# Patient Record
Sex: Female | Born: 1960 | Race: White | Hispanic: No | Marital: Married | Smoking: Never smoker
Health system: Southern US, Community
[De-identification: ages and names within clinical notes are randomized; demographics above are authoritative.]

## PROBLEM LIST (undated history)

## (undated) DIAGNOSIS — K602 Anal fissure, unspecified: Secondary | ICD-10-CM

## (undated) DIAGNOSIS — D649 Anemia, unspecified: Secondary | ICD-10-CM

## (undated) DIAGNOSIS — R51 Headache: Secondary | ICD-10-CM

## (undated) DIAGNOSIS — Z87442 Personal history of urinary calculi: Secondary | ICD-10-CM

## (undated) DIAGNOSIS — K219 Gastro-esophageal reflux disease without esophagitis: Secondary | ICD-10-CM

## (undated) DIAGNOSIS — C801 Malignant (primary) neoplasm, unspecified: Secondary | ICD-10-CM

## (undated) DIAGNOSIS — E063 Autoimmune thyroiditis: Secondary | ICD-10-CM

## (undated) DIAGNOSIS — K589 Irritable bowel syndrome without diarrhea: Secondary | ICD-10-CM

## (undated) DIAGNOSIS — R519 Headache, unspecified: Secondary | ICD-10-CM

## (undated) HISTORY — PX: COLONOSCOPY: SHX174

---

## 1984-04-15 HISTORY — PX: TUBAL LIGATION: SHX77

## 2001-04-15 HISTORY — PX: OTHER SURGICAL HISTORY: SHX169

## 2011-04-16 HISTORY — PX: BREAST SURGERY: SHX581

## 2016-12-25 ENCOUNTER — Ambulatory Visit: Payer: Self-pay | Admitting: General Surgery

## 2016-12-25 NOTE — H&P (Signed)
History of Present Illness Ralene Ok MD; 12/25/2016 2:19 PM) The patient is a 56 year old female who presents with an anal fissure. Patient is a 56 year old female who comes in today from a referral from Dr. Paulita Fujita. Patient has had a long-standing history of anal fissures. States this was since the birth of her child. Patient states that she has on and off resolution of the anal fissure. She states that recently over the last 6 months nothing seems to help. She has tried diltiazem as well as nitroglycerin with no resolution. She continues with anal pain as well as bleeding. She states that she has bleeding with bowel movements as well as in between bowel movements. Patient does state that she has tried sitz baths without any relief.   Past Surgical History Erline Levine, RN; 12/25/2016 1:49 PM) Breast Augmentation  Bilateral.  Diagnostic Studies History Erline Levine, RN; 12/25/2016 1:49 PM) Colonoscopy  >10 years ago Mammogram  >3 years ago Pap Smear  1-5 years ago  Allergies Erline Levine, RN; 12/25/2016 1:56 PM) No Known Drug Allergies 12/25/2016  Medication History Erline Levine, RN; 12/25/2016 1:58 PM) Zolpidem Tartrate (5MG  Tablet, Oral) Active. Cytomel (25MCG Tablet, Oral) Active. Doxycycline Hyclate (100MG  Capsule, Oral) Active. MetroNIDAZOLE (1% Gel, External) Active. Ondansetron HCl (8MG  Tablet, Oral) Active. Spironolactone (50MG  Tablet, Oral) Active. Synthroid (112MCG Tablet, Oral) Active. ValACYclovir HCl (1GM Tablet, Oral) Active. Medications Reconciled  Social History Erline Levine, RN; 12/25/2016 1:49 PM) Caffeine use  Coffee, Tea. No alcohol use  No drug use  Tobacco use  Never smoker.  Family History Erline Levine, RN; 12/25/2016 1:49 PM) Bleeding disorder  Father. Cancer  Father. Hypertension  Mother. Thyroid problems  Daughter, Mother, Sister.  Pregnancy / Birth History Erline Levine, RN; 12/25/2016 1:49 PM) Age at menarche  29  years. Age of menopause  93-56 Gravida  2 Irregular periods  Length (months) of breastfeeding  3-6 Maternal age  18-25 Para  2  Other Problems Erline Levine, RN; 12/25/2016 1:49 PM) Gastroesophageal Reflux Disease  Hemorrhoids  Melanoma  Migraine Headache  Thyroid Disease     Review of Systems Erline Levine RN; 12/25/2016 1:49 PM) General Present- Appetite Loss, Fatigue and Weight Loss. Not Present- Chills, Fever, Night Sweats and Weight Gain. Skin Not Present- Change in Wart/Mole, Dryness, Hives, Jaundice, New Lesions, Non-Healing Wounds, Rash and Ulcer. HEENT Not Present- Earache, Hearing Loss, Hoarseness, Nose Bleed, Oral Ulcers, Ringing in the Ears, Seasonal Allergies, Sinus Pain, Sore Throat, Visual Disturbances, Wears glasses/contact lenses and Yellow Eyes. Respiratory Not Present- Bloody sputum, Chronic Cough, Difficulty Breathing, Snoring and Wheezing. Breast Not Present- Breast Mass, Breast Pain, Nipple Discharge and Skin Changes. Cardiovascular Not Present- Chest Pain, Difficulty Breathing Lying Down, Leg Cramps, Palpitations, Rapid Heart Rate, Shortness of Breath and Swelling of Extremities. Gastrointestinal Present- Abdominal Pain, Bloating, Bloody Stool, Change in Bowel Habits, Chronic diarrhea, Constipation, Excessive gas, Gets full quickly at meals, Indigestion, Nausea, Rectal Pain and Vomiting. Not Present- Difficulty Swallowing and Hemorrhoids. Female Genitourinary Present- Pelvic Pain. Not Present- Frequency, Nocturia, Painful Urination and Urgency. Musculoskeletal Present- Joint Pain. Not Present- Back Pain, Joint Stiffness, Muscle Pain, Muscle Weakness and Swelling of Extremities. Neurological Present- Headaches. Not Present- Decreased Memory, Fainting, Numbness, Seizures, Tingling, Tremor, Trouble walking and Weakness. Psychiatric Not Present- Anxiety, Bipolar, Change in Sleep Pattern, Depression, Fearful and Frequent crying. Endocrine Not Present- Cold  Intolerance, Excessive Hunger, Hair Changes, Heat Intolerance, Hot flashes and New Diabetes. Hematology Not Present- Blood Thinners, Easy Bruising, Excessive bleeding, Gland problems, HIV  and Persistent Infections.  Vitals Erline Levine RN; 12/25/2016 1:59 PM) 12/25/2016 1:58 PM Weight: 114.8 lb Height: 64in Body Surface Area: 1.54 m Body Mass Index: 19.71 kg/m  Temp.: 98.64F  Pulse: 78 (Regular)  BP: 110/70 (Sitting, Left Arm, Standard)       Physical Exam Ralene Ok MD; 12/25/2016 2:20 PM) The physical exam findings are as follows: Note:Constitutional: No acute distress, conversant, appears stated age  Eyes: Anicteric sclerae, moist conjunctiva, no lid lag  Neck: No thyromegaly, trachea midline, no cervical lymphadenopathy  Lungs: Clear to auscultation biilaterally, normal respiratory effot  Cardiovascular: regular rate & rhythm, no murmurs, no peripheal edema, pedal pulses 2+  GI: Soft, no masses or hepatosplenomegaly, non-tender to palpation  Rectal: Patient has a posterior skin tag, hypertensive anal sphincter, inflammation-all CONSISTENT with anal fissure.   MSK: Normal gait, no clubbing cyanosis, edema  Skin: No rashes, palpation reveals normal skin turgor  Psychiatric: Appropriate judgment and insight, oriented to person, place, and time    Assessment & Plan Ralene Ok MD; 12/25/2016 2:22 PM) ANAL FISSURE (K60.2) Impression: 56 year old female with anal fissure. Patient has had a long history of anal fissure and has had multiple medical attempts at resolution. Patient continued has continued with a lot of anal pain. I discussed with her that we could proceed with either a chemical versus mechanical sphincterotomy. The patient will like to proceed with chemical sphincterotomy. I discussed with her the risks and benefits of the procedure to include but not limited to: Infection, bleeding, damage structures, possible incontinence. Patient  voiced understanding and wishes to proceed.

## 2016-12-26 ENCOUNTER — Encounter (HOSPITAL_COMMUNITY): Payer: Self-pay | Admitting: *Deleted

## 2016-12-26 NOTE — Progress Notes (Signed)
Spoke with pt for pre-op call. Pt denies cardiac history, chest pain or sob. Pt states she is not diabetic.  

## 2016-12-26 NOTE — Anesthesia Preprocedure Evaluation (Addendum)
Anesthesia Evaluation  Patient identified by MRN, date of birth, ID band Patient awake    Reviewed: Allergy & Precautions, NPO status , Patient's Chart, lab work & pertinent test results  Airway Mallampati: I  TM Distance: >3 FB Neck ROM: Full    Dental  (+) Dental Advisory Given, Teeth Intact   Pulmonary neg pulmonary ROS,    Pulmonary exam normal breath sounds clear to auscultation       Cardiovascular negative cardio ROS Normal cardiovascular exam Rhythm:Regular Rate:Normal     Neuro/Psych  Headaches, negative psych ROS   GI/Hepatic Neg liver ROS, GERD  ,IBS   Endo/Other  negative endocrine ROSHashimoto's Thyroiditis  Renal/GU negative Renal ROS  negative genitourinary   Musculoskeletal negative musculoskeletal ROS (+)   Abdominal   Peds  Hematology  (+) anemia ,   Anesthesia Other Findings Melanoma of RUE  Reproductive/Obstetrics                           Anesthesia Physical Anesthesia Plan  ASA: I  Anesthesia Plan: General   Post-op Pain Management:    Induction: Intravenous  PONV Risk Score and Plan: 4 or greater and Ondansetron, Dexamethasone, Midazolam, Scopolamine patch - Pre-op and Treatment may vary due to age or medical condition  Airway Management Planned: Oral ETT  Additional Equipment: None  Intra-op Plan:   Post-operative Plan: Extubation in OR  Informed Consent: I have reviewed the patients History and Physical, chart, labs and discussed the procedure including the risks, benefits and alternatives for the proposed anesthesia with the patient or authorized representative who has indicated his/her understanding and acceptance.   Dental advisory given and Dental Advisory Given  Plan Discussed with: CRNA and Anesthesiologist  Anesthesia Plan Comments: (LMA if patient to be in stirrups, ETT if prone)      Anesthesia Quick Evaluation

## 2016-12-27 ENCOUNTER — Encounter (HOSPITAL_COMMUNITY)
Admission: RE | Disposition: A | Discharge status: Home / Self Care | Payer: Self-pay | Source: Ambulatory Visit | Attending: General Surgery

## 2016-12-27 ENCOUNTER — Ambulatory Visit (HOSPITAL_COMMUNITY)
Admission: RE | Admit: 2016-12-27 | Discharge: 2016-12-27 | Disposition: A | Discharge status: Home / Self Care | Payer: BLUE CROSS/BLUE SHIELD | Source: Ambulatory Visit | Attending: General Surgery | Admitting: General Surgery

## 2016-12-27 ENCOUNTER — Ambulatory Visit (HOSPITAL_COMMUNITY): Payer: BLUE CROSS/BLUE SHIELD | Admitting: Anesthesiology

## 2016-12-27 ENCOUNTER — Encounter (HOSPITAL_COMMUNITY): Payer: Self-pay

## 2016-12-27 DIAGNOSIS — Z79899 Other long term (current) drug therapy: Secondary | ICD-10-CM | POA: Insufficient documentation

## 2016-12-27 DIAGNOSIS — E079 Disorder of thyroid, unspecified: Secondary | ICD-10-CM | POA: Diagnosis not present

## 2016-12-27 DIAGNOSIS — K219 Gastro-esophageal reflux disease without esophagitis: Secondary | ICD-10-CM | POA: Insufficient documentation

## 2016-12-27 DIAGNOSIS — K602 Anal fissure, unspecified: Secondary | ICD-10-CM | POA: Diagnosis not present

## 2016-12-27 DIAGNOSIS — R51 Headache: Secondary | ICD-10-CM | POA: Insufficient documentation

## 2016-12-27 HISTORY — DX: Gastro-esophageal reflux disease without esophagitis: K21.9

## 2016-12-27 HISTORY — DX: Malignant (primary) neoplasm, unspecified: C80.1

## 2016-12-27 HISTORY — DX: Irritable bowel syndrome without diarrhea: K58.9

## 2016-12-27 HISTORY — DX: Anal fissure, unspecified: K60.2

## 2016-12-27 HISTORY — DX: Personal history of urinary calculi: Z87.442

## 2016-12-27 HISTORY — DX: Headache, unspecified: R51.9

## 2016-12-27 HISTORY — DX: Anemia, unspecified: D64.9

## 2016-12-27 HISTORY — DX: Headache: R51

## 2016-12-27 HISTORY — DX: Autoimmune thyroiditis: E06.3

## 2016-12-27 LAB — BASIC METABOLIC PANEL
Anion gap: 7 (ref 5–15)
BUN: 7 mg/dL (ref 6–20)
CHLORIDE: 104 mmol/L (ref 101–111)
CO2: 27 mmol/L (ref 22–32)
CREATININE: 0.68 mg/dL (ref 0.44–1.00)
Calcium: 10 mg/dL (ref 8.9–10.3)
GFR calc non Af Amer: >60 mL/min (ref >60)
Glucose, Bld: 96 mg/dL (ref 65–99)
Potassium: 3.9 mmol/L (ref 3.5–5.1)
Sodium: 138 mmol/L (ref 135–145)

## 2016-12-27 LAB — CBC
HEMATOCRIT: 43.4 % (ref 36.0–46.0)
HEMOGLOBIN: 14.6 g/dL (ref 12.0–15.0)
MCH: 31.7 pg (ref 26.0–34.0)
MCHC: 33.6 g/dL (ref 30.0–36.0)
MCV: 94.3 fL (ref 78.0–100.0)
Platelets: 266 10*3/uL (ref 150–400)
RBC: 4.6 MIL/uL (ref 3.87–5.11)
RDW: 12.3 % (ref 11.5–15.5)
WBC: 5 10*3/uL (ref 4.0–10.5)

## 2016-12-27 SURGERY — EXAM UNDER ANESTHESIA
Anesthesia: General | Site: Buttocks

## 2016-12-27 MED ORDER — HYDROMORPHONE HCL 1 MG/ML IJ SOLN: 0.2500 mg | INTRAMUSCULAR | Status: DC | PRN

## 2016-12-27 MED ORDER — CHLORHEXIDINE GLUCONATE CLOTH 2 % EX PADS: 6.0000 | MEDICATED_PAD | Freq: Once | CUTANEOUS | Status: DC

## 2016-12-27 MED ORDER — MIDAZOLAM HCL 5 MG/5ML IJ SOLN
INTRAMUSCULAR | Status: DC | PRN
  Administered 2016-12-27: 2 mg via INTRAVENOUS

## 2016-12-27 MED ORDER — LACTATED RINGERS IV SOLN
INTRAVENOUS | Status: DC
  Administered 2016-12-27: 08:00:00 via INTRAVENOUS

## 2016-12-27 MED ORDER — 0.9 % SODIUM CHLORIDE (POUR BTL) OPTIME
TOPICAL | Status: DC | PRN
  Administered 2016-12-27: 1000 mL

## 2016-12-27 MED ORDER — OXYCODONE HCL 5 MG/5ML PO SOLN: 5.0000 mg | Freq: Once | ORAL | Status: DC | PRN

## 2016-12-27 MED ORDER — FENTANYL CITRATE (PF) 250 MCG/5ML IJ SOLN
INTRAMUSCULAR | Status: AC
  Filled 2016-12-27: qty 5

## 2016-12-27 MED ORDER — TRAMADOL HCL 50 MG PO TABS: 50.0000 mg | ORAL_TABLET | Freq: Four times a day (QID) | ORAL | 0 refills | Status: AC | PRN

## 2016-12-27 MED ORDER — EPHEDRINE SULFATE 50 MG/ML IJ SOLN
INTRAMUSCULAR | Status: DC | PRN
  Administered 2016-12-27: 10 mg via INTRAVENOUS

## 2016-12-27 MED ORDER — BUPIVACAINE LIPOSOME 1.3 % IJ SUSP
INTRAMUSCULAR | Status: DC | PRN
  Administered 2016-12-27: 40 mL

## 2016-12-27 MED ORDER — LACTATED RINGERS IV SOLN
INTRAVENOUS | Status: DC | PRN
  Administered 2016-12-27 (×2): via INTRAVENOUS

## 2016-12-27 MED ORDER — BUPIVACAINE-EPINEPHRINE (PF) 0.5% -1:200000 IJ SOLN
INTRAMUSCULAR | Status: DC | PRN
  Administered 2016-12-27: 1.8 mL via PERINEURAL

## 2016-12-27 MED ORDER — BUPIVACAINE-EPINEPHRINE (PF) 0.5% -1:200000 IJ SOLN
INTRAMUSCULAR | Status: AC
  Filled 2016-12-27: qty 30

## 2016-12-27 MED ORDER — ONABOTULINUMTOXINA 100 UNITS IJ SOLR
50.0000 [IU] | INTRAMUSCULAR | Status: AC
  Administered 2016-12-27: 100 [IU] via INTRAMUSCULAR
  Filled 2016-12-27 (×2): qty 100

## 2016-12-27 MED ORDER — MIDAZOLAM HCL 2 MG/2ML IJ SOLN
INTRAMUSCULAR | Status: AC
  Filled 2016-12-27: qty 2

## 2016-12-27 MED ORDER — PROPOFOL 10 MG/ML IV BOLUS
INTRAVENOUS | Status: DC | PRN
  Administered 2016-12-27: 150 mg via INTRAVENOUS

## 2016-12-27 MED ORDER — PROPOFOL 10 MG/ML IV BOLUS
INTRAVENOUS | Status: AC
  Filled 2016-12-27: qty 20

## 2016-12-27 MED ORDER — DIBUCAINE 1 % RE OINT
TOPICAL_OINTMENT | RECTAL | Status: AC
  Filled 2016-12-27: qty 28

## 2016-12-27 MED ORDER — FLEET ENEMA 7-19 GM/118ML RE ENEM: 1.0000 | ENEMA | Freq: Once | RECTAL | Status: DC

## 2016-12-27 MED ORDER — PHENYLEPHRINE HCL 10 MG/ML IJ SOLN
INTRAMUSCULAR | Status: DC | PRN
  Administered 2016-12-27 (×2): 80 ug via INTRAVENOUS

## 2016-12-27 MED ORDER — FENTANYL CITRATE (PF) 100 MCG/2ML IJ SOLN
INTRAMUSCULAR | Status: DC | PRN
  Administered 2016-12-27: 100 ug via INTRAVENOUS

## 2016-12-27 MED ORDER — LIDOCAINE HCL (CARDIAC) 20 MG/ML IV SOLN
INTRAVENOUS | Status: DC | PRN
  Administered 2016-12-27: 80 mg via INTRAVENOUS

## 2016-12-27 MED ORDER — OXYCODONE HCL 5 MG PO TABS: 5.0000 mg | ORAL_TABLET | Freq: Once | ORAL | Status: DC | PRN

## 2016-12-27 MED ORDER — BUPIVACAINE LIPOSOME 1.3 % IJ SUSP
20.0000 mL | INTRAMUSCULAR | Status: DC
  Filled 2016-12-27: qty 20

## 2016-12-27 MED ORDER — DEXAMETHASONE SODIUM PHOSPHATE 10 MG/ML IJ SOLN
INTRAMUSCULAR | Status: DC | PRN
  Administered 2016-12-27: 10 mg via INTRAVENOUS

## 2016-12-27 SURGICAL SUPPLY — 45 items
BLADE SURG 15 STRL LF DISP TIS (BLADE) ×1 IMPLANT
BLADE SURG 15 STRL SS (BLADE) ×2
CANISTER SUCT 3000ML PPV (MISCELLANEOUS) ×3 IMPLANT
COVER MAYO STAND STRL (DRAPES) ×3 IMPLANT
COVER SURGICAL LIGHT HANDLE (MISCELLANEOUS) ×3 IMPLANT
DECANTER SPIKE VIAL GLASS SM (MISCELLANEOUS) ×3 IMPLANT
DRSG PAD ABDOMINAL 8X10 ST (GAUZE/BANDAGES/DRESSINGS) ×3 IMPLANT
ELECT CAUTERY BLADE 6.4 (BLADE) ×3 IMPLANT
ELECT REM PT RETURN 9FT ADLT (ELECTROSURGICAL) ×3
ELECTRODE REM PT RTRN 9FT ADLT (ELECTROSURGICAL) ×1 IMPLANT
GAUZE SPONGE 4X4 12PLY STRL (GAUZE/BANDAGES/DRESSINGS) ×3 IMPLANT
GAUZE SPONGE 4X4 16PLY XRAY LF (GAUZE/BANDAGES/DRESSINGS) ×6 IMPLANT
GLOVE BIO SURGEON STRL SZ7.5 (GLOVE) ×3 IMPLANT
GLOVE BIOGEL PI IND STRL 8 (GLOVE) ×1 IMPLANT
GLOVE BIOGEL PI INDICATOR 8 (GLOVE) ×2
GOWN STRL REUS W/ TWL LRG LVL3 (GOWN DISPOSABLE) ×1 IMPLANT
GOWN STRL REUS W/ TWL XL LVL3 (GOWN DISPOSABLE) ×1 IMPLANT
GOWN STRL REUS W/TWL LRG LVL3 (GOWN DISPOSABLE) ×2
GOWN STRL REUS W/TWL XL LVL3 (GOWN DISPOSABLE) ×2
KIT BASIN OR (CUSTOM PROCEDURE TRAY) ×3 IMPLANT
KIT ROOM TURNOVER OR (KITS) ×3 IMPLANT
LOOP VESSEL MAXI BLUE (MISCELLANEOUS) IMPLANT
NEEDLE 18GX1X1/2 (RX/OR ONLY) (NEEDLE) ×3 IMPLANT
NEEDLE HYPO 25GX1X1/2 BEV (NEEDLE) ×6 IMPLANT
NEEDLE PRECISIONGLIDE 27X1.5 (NEEDLE) ×3 IMPLANT
NS IRRIG 1000ML POUR BTL (IV SOLUTION) ×3 IMPLANT
PACK LITHOTOMY IV (CUSTOM PROCEDURE TRAY) ×3 IMPLANT
PAD ABD 8X10 STRL (GAUZE/BANDAGES/DRESSINGS) ×3 IMPLANT
PAD ARMBOARD 7.5X6 YLW CONV (MISCELLANEOUS) ×3 IMPLANT
PENCIL BUTTON HOLSTER BLD 10FT (ELECTRODE) ×3 IMPLANT
SHEARS HARMONIC 9CM CVD (BLADE) IMPLANT
SPECIMEN JAR SMALL (MISCELLANEOUS) IMPLANT
SPONGE HEMORRHOID 8X3CM (HEMOSTASIS) IMPLANT
SURGILUBE 2OZ TUBE FLIPTOP (MISCELLANEOUS) ×3 IMPLANT
SUT CHROMIC 2 0 SH (SUTURE) IMPLANT
SUT CHROMIC 3 0 SH 27 (SUTURE) IMPLANT
SYR 10ML LL (SYRINGE) ×3 IMPLANT
SYR BULB 3OZ (MISCELLANEOUS) IMPLANT
SYR CONTROL 10ML LL (SYRINGE) ×3 IMPLANT
SYR TB 1ML LUER SLIP (SYRINGE) ×3 IMPLANT
TOWEL OR 17X24 6PK STRL BLUE (TOWEL DISPOSABLE) ×3 IMPLANT
TOWEL OR 17X26 10 PK STRL BLUE (TOWEL DISPOSABLE) ×3 IMPLANT
TUBE CONNECTING 12'X1/4 (SUCTIONS) ×1
TUBE CONNECTING 12X1/4 (SUCTIONS) ×2 IMPLANT
YANKAUER SUCT BULB TIP NO VENT (SUCTIONS) ×3 IMPLANT

## 2016-12-27 NOTE — Anesthesia Procedure Notes (Signed)
Procedure Name: LMA Insertion Date/Time: 12/27/2016 9:51 AM Performed by: Neldon Newport Pre-anesthesia Checklist: Timeout performed, Patient being monitored, Suction available, Emergency Drugs available and Patient identified Patient Re-evaluated:Patient Re-evaluated prior to induction Oxygen Delivery Method: Circle system utilized Induction Type: IV induction Ventilation: Mask ventilation without difficulty LMA: LMA inserted LMA Size: 4.0 Placement Confirmation: positive ETCO2 Tube secured with: Tape Dental Injury: Teeth and Oropharynx as per pre-operative assessment

## 2016-12-27 NOTE — Discharge Instructions (Signed)
Anal Fissure, Adult An anal fissure is a small tear or crack in the skin around the opening of the butt (anus).Bleeding from the tear or crack usually stops on its own within a few minutes. The bleeding may happen every time you poop (have a bowel movement) until the tear or crack heals. Follow these instructions at home: Eating and drinking  Avoid bananas and dairy products. These foods can make it hard to poop.  Drink enough fluid to keep your pee (urine) clear or pale yellow.  Eat a lot of fruit, whole grains, and vegetables. General instructions  Keep the butt area as clean and dry as you can.  Take a warm water bath (sitz bath) as told by your doctor. Do not use soap.  Take over-the-counter and prescription medicines only as told by your doctor.  Use creams or ointments only as told by your doctor.  Keep all follow-up visits as told by your doctor. This is important. Contact a doctor if:  You have more bleeding.  You have a fever.  You have watery poop (diarrhea) that is mixed with blood.  You have pain.  You problem gets worse, not better. This information is not intended to replace advice given to you by your health care provider. Make sure you discuss any questions you have with your health care provider. Document Released: 11/28/2010 Document Revised: 09/07/2015 Document Reviewed: 06/27/2014 Elsevier Interactive Patient Education  2018 [employer]   

## 2016-12-27 NOTE — Op Note (Signed)
12/27/2016  10:16 AM  PATIENT:  Ebony Casey  56 y.o. 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 Patient pain and bleeding with bowel movements. Patient did have hypotensive anal sphincter in clinic and cannot tolerate full exam. Patient was brought to the OR for chemical sphincterotomy and exam under anesthesia.  Details of procedure: After the patient was consented she was taken back to the operating room placed in supine position with bilateral varices in place. She underwent general endotracheal anesthesia. Patient was placed in lithotomy position. The patient was prepped and draped in the standard fashion. Timeout was called all facts verified.  Digital rectal exam began the procedure. I was able to palpate what appeared to be a defect in the posterior midline. There was a hypotensive anal sphincter. At this time a rectal retractor was placed within the rectum. The anal fissure could be easily seen in the posterior midline. A sentinel tag was also visualized.  At this time a digital he was able to palpate the internal and external sphincter. The external sphincter was grasped with an Allis clamp. 5 units of Botox were injected alongside the anal fissure bilaterally. I then proceeded to palpate grasp the internal anal sphincter anteriorly. 20 units of Botox was injected into the anterior  portion. There was some minor bleeding.  Total of 40U of Botox was injected.  20 mL Exparil was diluted  with 20 mL of saline. This was then circumferentially injected around the anus to help with pain. The patient was dressed with 4x4s and mesh panties. The patient tied the procedure well was taken to the recovery room stable condition.    PLAN OF CARE: Discharge to home after PACU  PATIENT DISPOSITION:  PACU - hemodynamically stable.   Delay start of Pharmacological VTE agent (>24hrs) due to surgical blood loss or risk of bleeding: not applicable

## 2016-12-27 NOTE — Transfer of Care (Signed)
Immediate Anesthesia Transfer of Care Note  Patient: Ebony Casey  Procedure(s) Performed: Procedure(s): EXAM UNDER ANESTHESIA, CHEMICAL SPHINCTEROTOMY, BOTOX (N/A)  Patient Location: PACU  Anesthesia Type:General  Level of Consciousness: awake, alert  and oriented  Airway & Oxygen Therapy: Patient Spontanous Breathing and Patient connected to nasal cannula oxygen  Post-op Assessment: Report given to RN, Post -op Vital signs reviewed and stable and Patient moving all extremities X 4  Post vital signs: Reviewed and stable  Last Vitals:  Vitals:   12/27/16 0804  BP: 120/74  Pulse: 68  Resp: 18  Temp: 36.6 C  SpO2: 100%    Last Pain:  Vitals:   12/27/16 0811  TempSrc:   PainSc: 5       Patients Stated Pain Goal: 2 (23/76/28 3151)  Complications: No apparent anesthesia complications

## 2016-12-27 NOTE — Anesthesia Postprocedure Evaluation (Signed)
Anesthesia Post Note  Patient: Ebony Casey  Procedure(s) Performed: Procedure(s) (LRB): EXAM UNDER ANESTHESIA, CHEMICAL SPHINCTEROTOMY, BOTOX (N/A)     Patient location during evaluation: PACU Anesthesia Type: General Level of consciousness: awake and alert Pain management: pain level controlled Vital Signs Assessment: post-procedure vital signs reviewed and stable Respiratory status: spontaneous breathing, nonlabored ventilation, respiratory function stable and patient connected to nasal cannula oxygen Cardiovascular status: blood pressure returned to baseline and stable Postop Assessment: no apparent nausea or vomiting Anesthetic complications: no    Last Vitals:  Vitals:   12/27/16 1045 12/27/16 1100  BP: 119/72 114/74  Pulse: 88 87  Resp: 12 16  Temp:  36.5 C  SpO2: 100% 100%    Last Pain:  Vitals:   12/27/16 1100  TempSrc:   PainSc: 0-No pain                 Long Brimage,JAMES TERRILL

## 2016-12-27 NOTE — Interval H&P Note (Signed)
History and Physical Interval Note:  12/27/2016 8:56 AM  Ebony Casey  has presented today for surgery, with the diagnosis of ANAL FISURE  The various methods of treatment have been discussed with the patient and family. After consideration of risks, benefits and other options for treatment, the patient has consented to  Procedure(s): EXAM UNDER ANESTHESIA, CHEMICAL SPHINCTEROTOMY, BOTOX (N/A) as a surgical intervention .  The patient's history has been reviewed, patient examined, no change in status, stable for surgery.  I have reviewed the patient's chart and labs.  Questions were answered to the patient's satisfaction.     Rosario Jacks., Anne Hahn

## 2016-12-27 NOTE — H&P (View-Only) (Signed)
History of Present Illness Ralene Ok MD; 12/25/2016 2:19 PM) The patient is a 56 year old female who presents with an anal fissure. Patient is a 56 year old female who comes in today from a referral from Dr. Paulita Fujita. Patient has had a long-standing history of anal fissures. States this was since the birth of her child. Patient states that she has on and off resolution of the anal fissure. She states that recently over the last 6 months nothing seems to help. She has tried diltiazem as well as nitroglycerin with no resolution. She continues with anal pain as well as bleeding. She states that she has bleeding with bowel movements as well as in between bowel movements. Patient does state that she has tried sitz baths without any relief.   Past Surgical History Erline Levine, RN; 12/25/2016 1:49 PM) Breast Augmentation  Bilateral.  Diagnostic Studies History Erline Levine, RN; 12/25/2016 1:49 PM) Colonoscopy  >10 years ago Mammogram  >3 years ago Pap Smear  1-5 years ago  Allergies Erline Levine, RN; 12/25/2016 1:56 PM) No Known Drug Allergies 12/25/2016  Medication History Erline Levine, RN; 12/25/2016 1:58 PM) Zolpidem Tartrate (5MG  Tablet, Oral) Active. Cytomel (25MCG Tablet, Oral) Active. Doxycycline Hyclate (100MG  Capsule, Oral) Active. MetroNIDAZOLE (1% Gel, External) Active. Ondansetron HCl (8MG  Tablet, Oral) Active. Spironolactone (50MG  Tablet, Oral) Active. Synthroid (112MCG Tablet, Oral) Active. ValACYclovir HCl (1GM Tablet, Oral) Active. Medications Reconciled  Social History Erline Levine, RN; 12/25/2016 1:49 PM) Caffeine use  Coffee, Tea. No alcohol use  No drug use  Tobacco use  Never smoker.  Family History Erline Levine, RN; 12/25/2016 1:49 PM) Bleeding disorder  Father. Cancer  Father. Hypertension  Mother. Thyroid problems  Daughter, Mother, Sister.  Pregnancy / Birth History Erline Levine, RN; 12/25/2016 1:49 PM) Age at menarche  47  years. Age of menopause  35-55 Gravida  2 Irregular periods  Length (months) of breastfeeding  3-6 Maternal age  19-25 Para  2  Other Problems Erline Levine, RN; 12/25/2016 1:49 PM) Gastroesophageal Reflux Disease  Hemorrhoids  Melanoma  Migraine Headache  Thyroid Disease     Review of Systems Erline Levine RN; 12/25/2016 1:49 PM) General Present- Appetite Loss, Fatigue and Weight Loss. Not Present- Chills, Fever, Night Sweats and Weight Gain. Skin Not Present- Change in Wart/Mole, Dryness, Hives, Jaundice, New Lesions, Non-Healing Wounds, Rash and Ulcer. HEENT Not Present- Earache, Hearing Loss, Hoarseness, Nose Bleed, Oral Ulcers, Ringing in the Ears, Seasonal Allergies, Sinus Pain, Sore Throat, Visual Disturbances, Wears glasses/contact lenses and Yellow Eyes. Respiratory Not Present- Bloody sputum, Chronic Cough, Difficulty Breathing, Snoring and Wheezing. Breast Not Present- Breast Mass, Breast Pain, Nipple Discharge and Skin Changes. Cardiovascular Not Present- Chest Pain, Difficulty Breathing Lying Down, Leg Cramps, Palpitations, Rapid Heart Rate, Shortness of Breath and Swelling of Extremities. Gastrointestinal Present- Abdominal Pain, Bloating, Bloody Stool, Change in Bowel Habits, Chronic diarrhea, Constipation, Excessive gas, Gets full quickly at meals, Indigestion, Nausea, Rectal Pain and Vomiting. Not Present- Difficulty Swallowing and Hemorrhoids. Female Genitourinary Present- Pelvic Pain. Not Present- Frequency, Nocturia, Painful Urination and Urgency. Musculoskeletal Present- Joint Pain. Not Present- Back Pain, Joint Stiffness, Muscle Pain, Muscle Weakness and Swelling of Extremities. Neurological Present- Headaches. Not Present- Decreased Memory, Fainting, Numbness, Seizures, Tingling, Tremor, Trouble walking and Weakness. Psychiatric Not Present- Anxiety, Bipolar, Change in Sleep Pattern, Depression, Fearful and Frequent crying. Endocrine Not Present- Cold  Intolerance, Excessive Hunger, Hair Changes, Heat Intolerance, Hot flashes and New Diabetes. Hematology Not Present- Blood Thinners, Easy Bruising, Excessive bleeding, Gland problems, HIV  and Persistent Infections.  Vitals Erline Levine RN; 12/25/2016 1:59 PM) 12/25/2016 1:58 PM Weight: 114.8 lb Height: 64in Body Surface Area: 1.54 m Body Mass Index: 19.71 kg/m  Temp.: 98.52F  Pulse: 78 (Regular)  BP: 110/70 (Sitting, Left Arm, Standard)       Physical Exam Ralene Ok MD; 12/25/2016 2:20 PM) The physical exam findings are as follows: Note:Constitutional: No acute distress, conversant, appears stated age  Eyes: Anicteric sclerae, moist conjunctiva, no lid lag  Neck: No thyromegaly, trachea midline, no cervical lymphadenopathy  Lungs: Clear to auscultation biilaterally, normal respiratory effot  Cardiovascular: regular rate & rhythm, no murmurs, no peripheal edema, pedal pulses 2+  GI: Soft, no masses or hepatosplenomegaly, non-tender to palpation  Rectal: Patient has a posterior skin tag, hypertensive anal sphincter, inflammation-all CONSISTENT with anal fissure.   MSK: Normal gait, no clubbing cyanosis, edema  Skin: No rashes, palpation reveals normal skin turgor  Psychiatric: Appropriate judgment and insight, oriented to person, place, and time    Assessment & Plan Ralene Ok MD; 12/25/2016 2:22 PM) ANAL FISSURE (K60.2) Impression: 56 year old female with anal fissure. Patient has had a long history of anal fissure and has had multiple medical attempts at resolution. Patient continued has continued with a lot of anal pain. I discussed with her that we could proceed with either a chemical versus mechanical sphincterotomy. The patient will like to proceed with chemical sphincterotomy. I discussed with her the risks and benefits of the procedure to include but not limited to: Infection, bleeding, damage structures, possible incontinence. Patient  voiced understanding and wishes to proceed.

## 2017-01-08 ENCOUNTER — Other Ambulatory Visit: Payer: Self-pay | Admitting: Gastroenterology

## 2017-01-09 ENCOUNTER — Other Ambulatory Visit: Payer: Self-pay | Admitting: Gastroenterology

## 2017-01-09 DIAGNOSIS — R634 Abnormal weight loss: Secondary | ICD-10-CM

## 2017-01-09 DIAGNOSIS — R1013 Epigastric pain: Secondary | ICD-10-CM

## 2017-01-09 DIAGNOSIS — R140 Abdominal distension (gaseous): Secondary | ICD-10-CM

## 2017-01-16 ENCOUNTER — Ambulatory Visit
Admission: RE | Admit: 2017-01-16 | Discharge: 2017-01-16 | Disposition: A | Discharge status: Home / Self Care | Payer: BLUE CROSS/BLUE SHIELD | Source: Ambulatory Visit | Attending: Gastroenterology | Admitting: Gastroenterology

## 2017-01-16 DIAGNOSIS — R1013 Epigastric pain: Secondary | ICD-10-CM

## 2017-01-16 DIAGNOSIS — R140 Abdominal distension (gaseous): Secondary | ICD-10-CM

## 2017-01-16 DIAGNOSIS — R634 Abnormal weight loss: Secondary | ICD-10-CM

## 2017-12-23 IMAGING — US US ABDOMEN COMPLETE
1 series · 14 of 25 positions shown · non-contrast
Comparison: None.

CLINICAL DATA: Epigastric pain

EXAM:
ABDOMEN ULTRASOUND COMPLETE

[Series 1: us abdomen complete · 0.11mm/px · 14 of 81 slices shown]
[im 1/81]
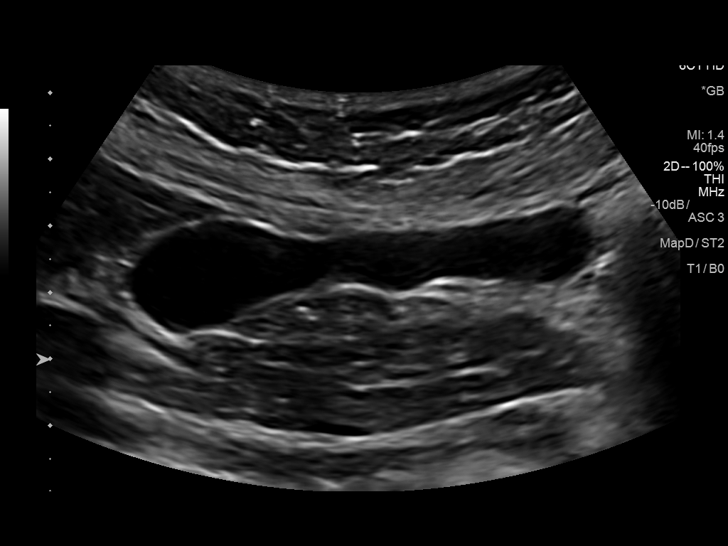
[im 7/81]
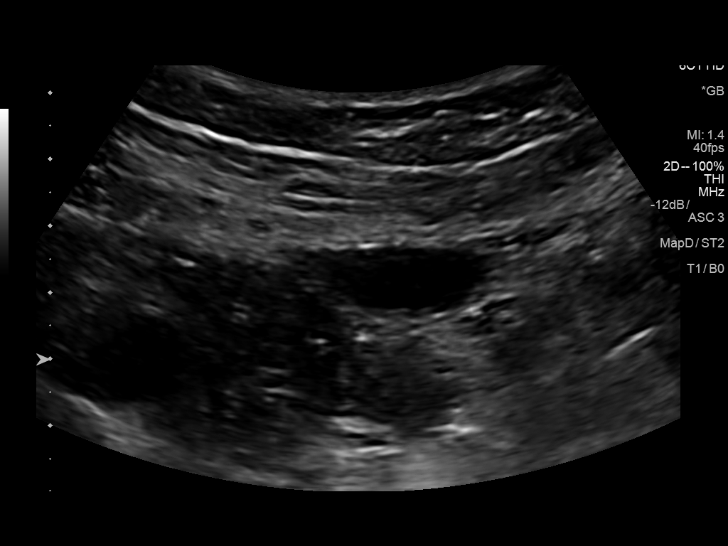
[im 14/81]
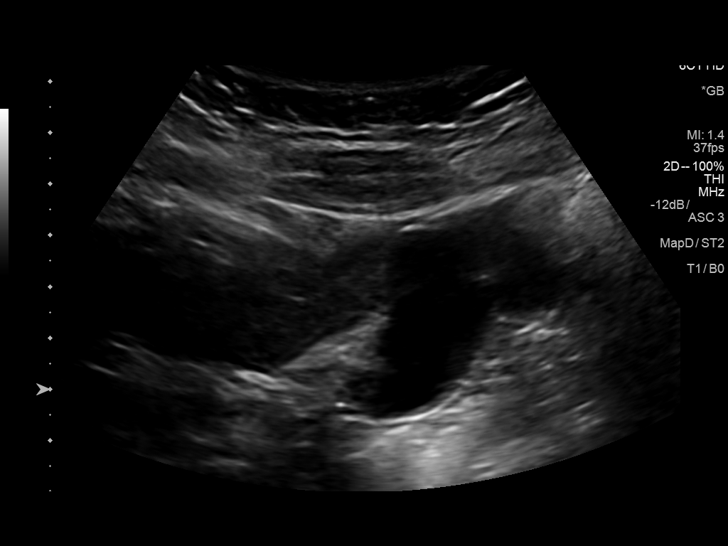
[im 21/81]
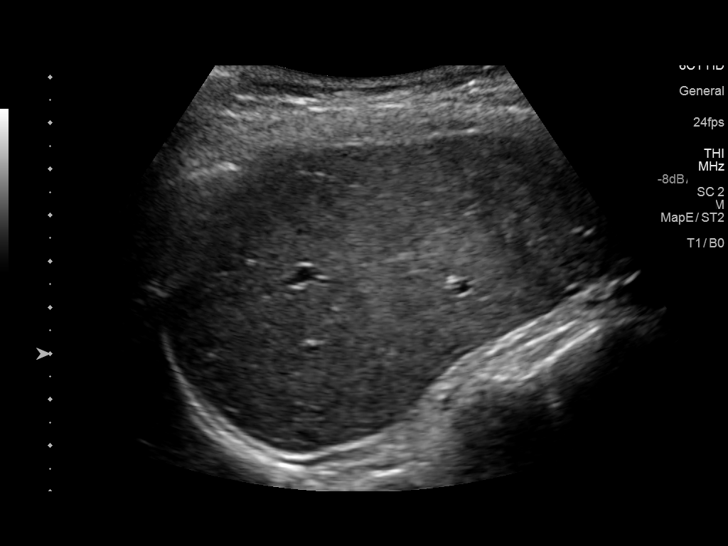
[im 27/81]
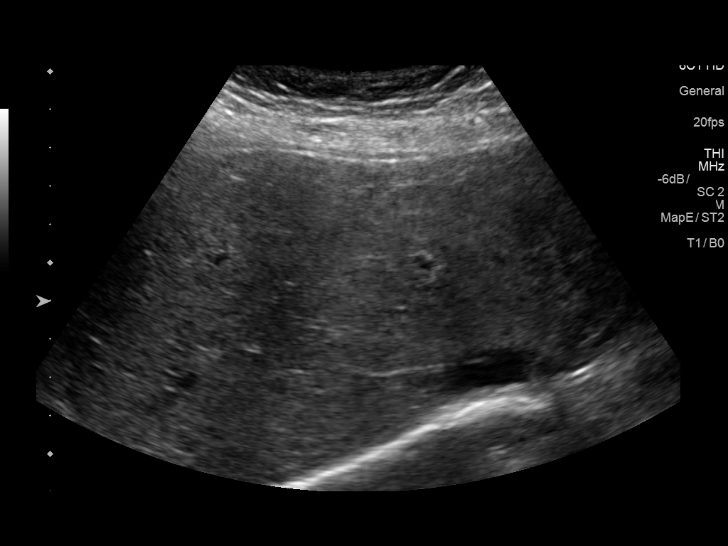
[im 31/81]
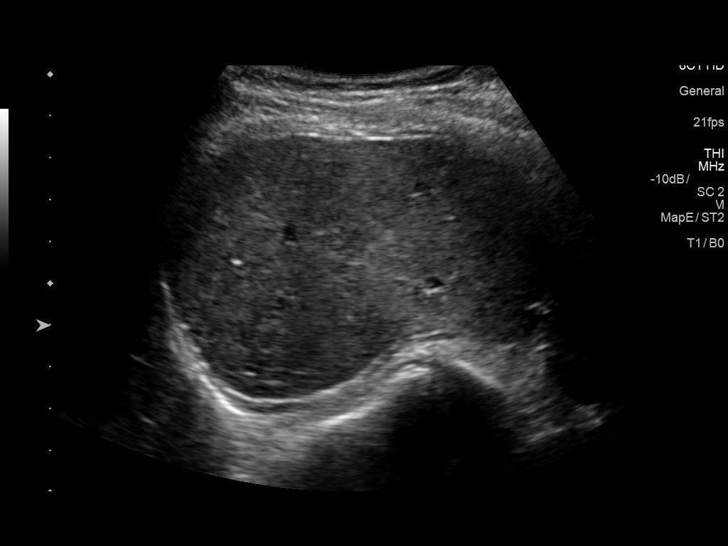
[im 37/81]
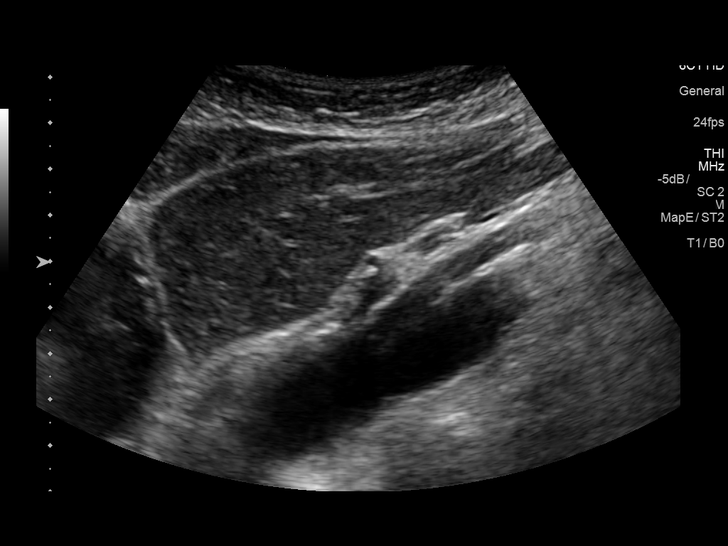
[im 44/81]
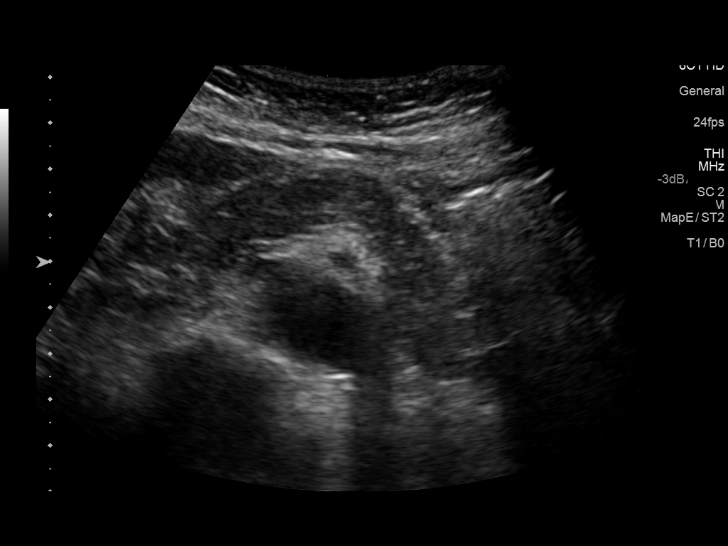
[im 51/81]
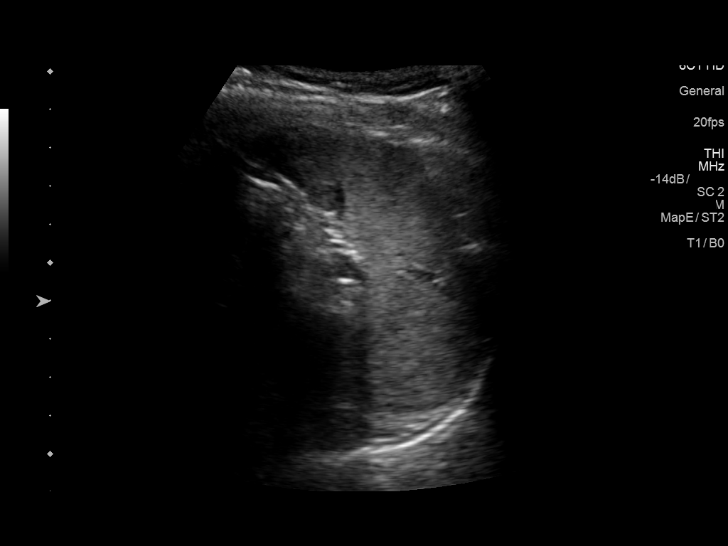
[im 54/81]
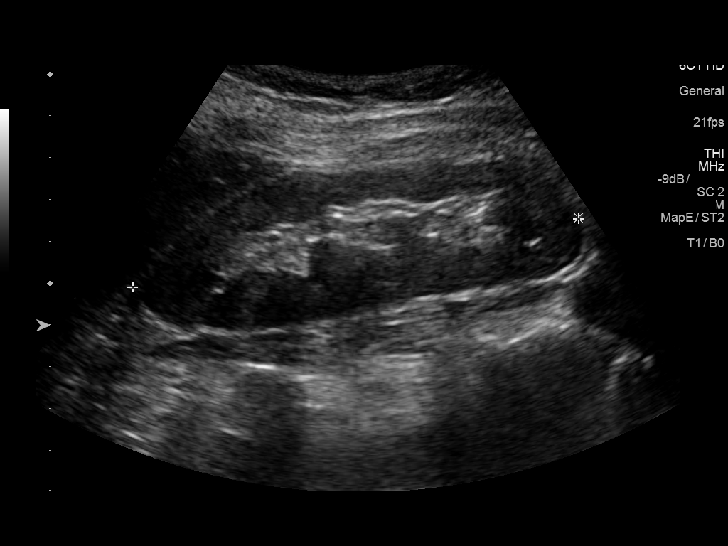
[im 61/81]
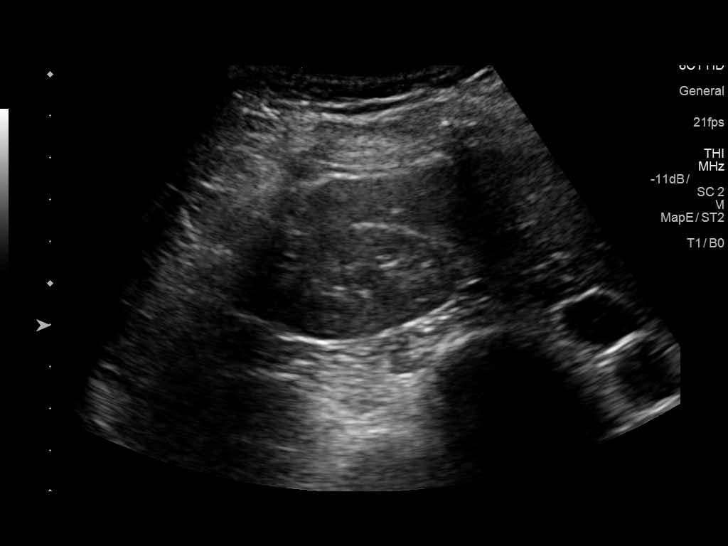
[im 67/81]
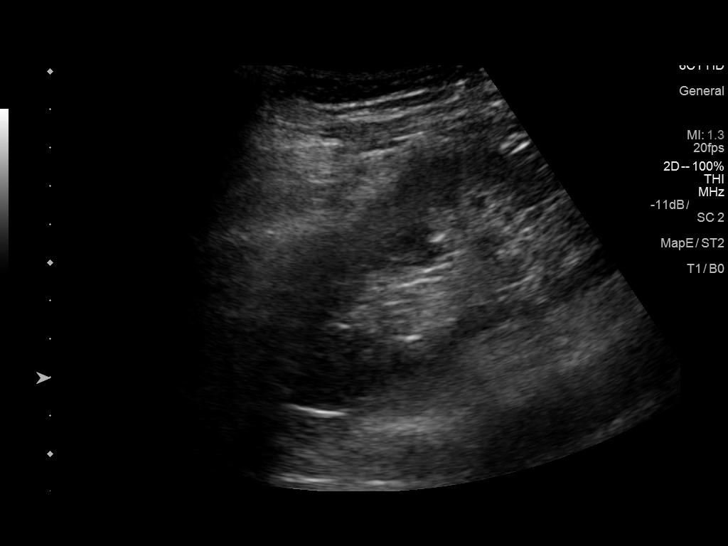
[im 74/81]
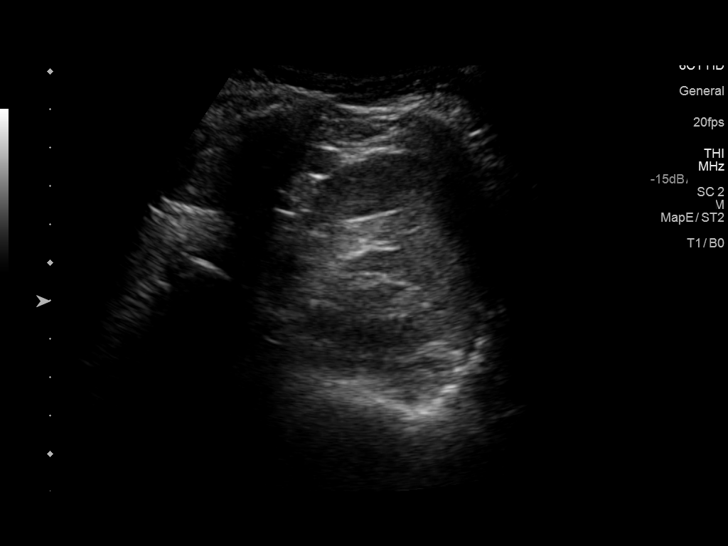
[im 81/81]
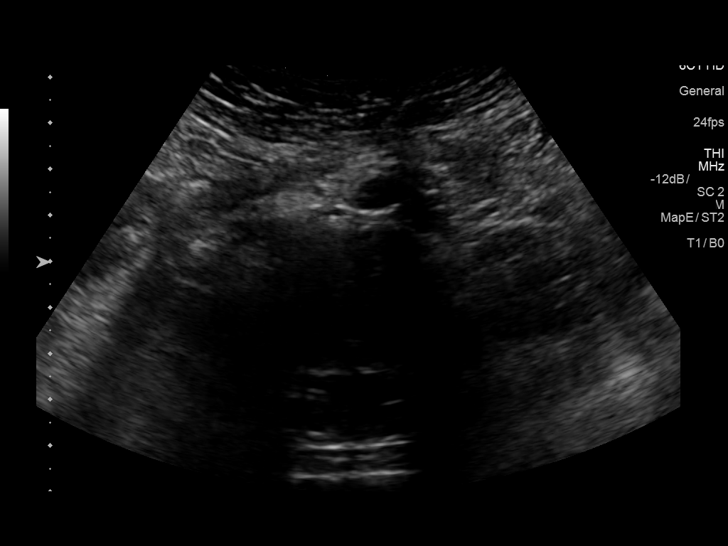

[14 of 25 positions shown; findings below may reference images not displayed]

FINDINGS: Gallbladder: No gallstones or wall thickening visualized. No
sonographic Murphy sign noted by sonographer.

Common bile duct: Diameter: 4 mm.

Liver: No focal lesion identified. Within normal limits in
parenchymal echogenicity. Portal vein is patent on color Doppler
imaging with normal direction of blood flow towards the liver.

IVC: No abnormality visualized.

Pancreas: Visualized portion unremarkable.

Spleen: Size and appearance within normal limits.

Right Kidney: Length: 10.8 cm.. Echogenicity within normal limits.
No mass or hydronephrosis visualized.

Left Kidney: Length: 10.8 cm.. Echogenicity within normal limits. No
mass or hydronephrosis visualized.

Abdominal aorta: No aneurysm visualized.

Other findings: None.
IMPRESSION: No acute abnormality noted.

## 2021-12-27 ENCOUNTER — Ambulatory Visit: Payer: BLUE CROSS/BLUE SHIELD | Admitting: Internal Medicine
